# Patient Record
Sex: Female | Born: 1995 | Race: Black or African American | Hispanic: No | Marital: Single | Smoking: Current some day smoker
Health system: Southern US, Community
[De-identification: ages and names within clinical notes are randomized; demographics above are authoritative.]

## PROBLEM LIST (undated history)

## (undated) HISTORY — PX: HERNIA REPAIR: SHX51

---

## 2017-01-05 ENCOUNTER — Encounter: Payer: Self-pay | Admitting: Certified Nurse Midwife

## 2017-01-13 ENCOUNTER — Emergency Department (HOSPITAL_COMMUNITY)
Admission: EM | Admit: 2017-01-13 | Discharge: 2017-01-13 | Disposition: A | Discharge status: Home / Self Care | Payer: Self-pay | Attending: Emergency Medicine | Admitting: Emergency Medicine

## 2017-01-13 ENCOUNTER — Encounter (HOSPITAL_COMMUNITY): Payer: Self-pay | Admitting: *Deleted

## 2017-01-13 ENCOUNTER — Other Ambulatory Visit: Payer: Self-pay

## 2017-01-13 DIAGNOSIS — F17200 Nicotine dependence, unspecified, uncomplicated: Secondary | ICD-10-CM | POA: Insufficient documentation

## 2017-01-13 DIAGNOSIS — Z5321 Procedure and treatment not carried out due to patient leaving prior to being seen by health care provider: Secondary | ICD-10-CM | POA: Insufficient documentation

## 2017-01-13 DIAGNOSIS — R102 Pelvic and perineal pain: Secondary | ICD-10-CM | POA: Insufficient documentation

## 2017-01-13 DIAGNOSIS — K529 Noninfective gastroenteritis and colitis, unspecified: Secondary | ICD-10-CM | POA: Insufficient documentation

## 2017-01-13 LAB — COMPREHENSIVE METABOLIC PANEL
ALT: 11 U/L — AB (ref 14–54)
ANION GAP: 8 (ref 5–15)
AST: 15 U/L (ref 15–41)
Albumin: 4.1 g/dL (ref 3.5–5.0)
Alkaline Phosphatase: 42 U/L (ref 38–126)
BUN: 18 mg/dL (ref 6–20)
CALCIUM: 8.8 mg/dL — AB (ref 8.9–10.3)
CHLORIDE: 104 mmol/L (ref 101–111)
CO2: 24 mmol/L (ref 22–32)
CREATININE: 0.93 mg/dL (ref 0.44–1.00)
Glucose, Bld: 90 mg/dL (ref 65–99)
Potassium: 3.5 mmol/L (ref 3.5–5.1)
SODIUM: 136 mmol/L (ref 135–145)
Total Bilirubin: 1.1 mg/dL (ref 0.3–1.2)
Total Protein: 7 g/dL (ref 6.5–8.1)

## 2017-01-13 LAB — URINALYSIS, ROUTINE W REFLEX MICROSCOPIC
Bacteria, UA: NONE SEEN
Bilirubin Urine: NEGATIVE
Glucose, UA: NEGATIVE mg/dL
Hgb urine dipstick: NEGATIVE
Ketones, ur: 5 mg/dL — AB
Leukocytes, UA: NEGATIVE
Nitrite: NEGATIVE
Protein, ur: 30 mg/dL — AB
Specific Gravity, Urine: 1.03 (ref 1.005–1.030)
pH: 5 (ref 5.0–8.0)

## 2017-01-13 LAB — I-STAT BETA HCG BLOOD, ED (MC, WL, AP ONLY): I-stat hCG, quantitative: <5 m[IU]/mL

## 2017-01-13 LAB — CBC
HCT: 36.9 % (ref 36.0–46.0)
Hemoglobin: 12.7 g/dL (ref 12.0–15.0)
MCH: 30.5 pg (ref 26.0–34.0)
MCHC: 34.4 g/dL (ref 30.0–36.0)
MCV: 88.5 fL (ref 78.0–100.0)
Platelets: 270 10*3/uL (ref 150–400)
RBC: 4.17 MIL/uL (ref 3.87–5.11)
RDW: 12.4 % (ref 11.5–15.5)
WBC: 5.1 10*3/uL (ref 4.0–10.5)

## 2017-01-13 LAB — LIPASE, BLOOD: Lipase: 27 U/L (ref 11–51)

## 2017-01-13 NOTE — ED Notes (Signed)
Patient left her BP cuff and stickers on nurse first desk and walked out ED entrance. Pt ambulated with steady gait. NAD noted upon her leaving.

## 2017-01-13 NOTE — ED Triage Notes (Signed)
Pt arrives to ED via POV with c/o generalized ABD pain x2 days with N/V. Pt reports seeking treatment at [facility] shortly PTA, but left d/t wait time. Chart review shows that blood work, UA, and preg tests were all completed at [facility]. Pt denies fever, no CP or SHOB, no diarrhea. Pt is A&O, in NAD; RR even, regular, and unlabored; skin color/temp is WNL.

## 2017-01-13 NOTE — ED Triage Notes (Signed)
Pt reports abd cramping and bleeding that started yesterday. Having vaginal bleeding and states it is not time for her normal menstrual. Denies any other vaginal discharge or urinary symptoms.

## 2017-01-14 ENCOUNTER — Emergency Department: Payer: Self-pay

## 2017-01-14 ENCOUNTER — Emergency Department
Admission: EM | Admit: 2017-01-14 | Discharge: 2017-01-14 | Disposition: A | Discharge status: Home / Self Care | Payer: Self-pay | Attending: Emergency Medicine | Admitting: Emergency Medicine

## 2017-01-14 DIAGNOSIS — R102 Pelvic and perineal pain: Secondary | ICD-10-CM

## 2017-01-14 LAB — CHLAMYDIA/NGC RT PCR (ARMC ONLY)
Chlamydia Tr: NOT DETECTED
N gonorrhoeae: NOT DETECTED

## 2017-01-14 LAB — WET PREP, GENITAL
CLUE CELLS WET PREP: NONE SEEN
SPERM: NONE SEEN
TRICH WET PREP: NONE SEEN
WBC, Wet Prep HPF POC: NONE SEEN
Yeast Wet Prep HPF POC: NONE SEEN

## 2017-01-14 NOTE — ED Notes (Signed)
Pelvic cart at bedside. 

## 2017-01-14 NOTE — ED Notes (Signed)
Patient transported to Ultrasound 

## 2017-01-14 NOTE — ED Notes (Signed)

## 2017-01-14 NOTE — ED Notes (Signed)
ED Provider at bedside. 

## 2017-01-14 NOTE — ED Notes (Signed)
Pt states being woken from sleep yesterday morning by an aching pain to the left mid-lower abdomen. Pt states consistent pain, x1 episode vomiting,, denies diarrhea, denies urinary changes, denies fever, denies OTC tx. Pt states hx ovarian cysts.

## 2017-01-14 NOTE — ED Provider Notes (Signed)
[facility] Emergency Department Provider Note __   First MD Initiated Contact with Patient 01/14/17 0023     (approximate)  I have reviewed the triage vital signs and the nursing notes.   HISTORY  Chief Complaint Abdominal Pain; Nausea; and Emesis   HPI Brianna Atkinson is a 21 y.o. female presents withleft lower quadrant abdominal pain 2 days accompanied by nausea and one episode of vomiting tonight. Patient states maximum intensity of pain is 8/9 out of 10 current pain score is 5 out of 10. Patient denies any diarrhea. Patient states discomfort when she has to hold her urine". Patient denies any fever afebrile on presentation. Patient denies any unusual vaginal discharge. Patient does admit to unprotected sex.   Past medical history Chlamydia There are no active problems to display for this patient.   Past Surgical History:  Procedure Laterality Date  . HERNIA REPAIR      Prior to Admission medications   Medication Sig Start Date End Date Taking? Authorizing Provider  naproxen (NAPROSYN) 500 MG tablet Take 500 mg 2 (two) times daily as needed by mouth (for back pain).  09/04/16 09/04/17  [provider]    Allergies no known drug allergies No family history on file.  Social History Social History   Tobacco Use  . Smoking status: Current Some Day Smoker  . Smokeless tobacco: Never Used  Substance Use Topics  . Alcohol use: No    Frequency: Never  . Drug use: No    Review of Systems Constitutional: No fever/chills Eyes: No visual changes. ENT: No sore throat. Cardiovascular: Denies chest pain. Respiratory: Denies shortness of breath. Gastrointestinal:  positive for abdominal pain.  No nausea, positive vomiting.  No diarrhea.  No constipation. Genitourinary: Positive for dysuria. Positive for pelvic pain Musculoskeletal: Negative for neck pain.  Negative for back pain. Integumentary: Negative for rash. Neurological: Negative  for headaches, focal weakness or numbness.   ____________________________________________   PHYSICAL EXAM:  VITAL SIGNS: ED Triage Vitals  Enc Vitals Group     BP 01/13/17 2048 126/78     Pulse Rate 01/13/17 2048 90     Resp 01/13/17 2048 17     Temp 01/13/17 2048 98.6 F (37 C)     Temp Source 01/13/17 2048 Oral     SpO2 01/13/17 2048 100 %     Weight 01/13/17 2049 61.7 kg (136 lb)     Height 01/13/17 2049 1.524 m (5')     Head Circumference --      Peak Flow --      Pain Score 01/13/17 2048 5     Pain Loc --      Pain Edu? --      Excl. in GC? --     Constitutional: Alert and oriented. Well appearing and in no acute distress. Eyes: Conjunctivae are normal. PERRL. EOMI. Head: Atraumatic. Mouth/Throat: Mucous membranes are moist.  Oropharynx non-erythematous. Neck: No stridor.  Cardiovascular: Normal rate, regular rhythm. Good peripheral circulation. Grossly normal heart sounds. Respiratory: Normal respiratory effort.  No retractions. Lungs CTAB. Gastrointestinal: Soft and nontender. No distention.   Genitourinary: Active menses Musculoskeletal: No lower extremity tenderness nor edema. No gross deformities of extremities. Neurologic:  Normal speech and language. No gross focal neurologic deficits are appreciated.  Skin:  Skin is warm, dry and intact. No rash noted. Psychiatric: Mood and affect are normal. Speech and behavior are normal.  ____________________________________________   LABS (all labs ordered are listed, but only  abnormal results are displayed)  Labs Reviewed  CHLAMYDIA/NGC RT PCR ([facility] ONLY)  WET PREP, GENITAL   ________________________________  RADIOLOGY I, Eden N BROWN, personally viewed and evaluated these images (plain radiographs) as part of my medical decision making, as well as reviewing the written report by the radiologist.  Ct Renal Stone Study  Result Date: 01/14/2017 CLINICAL DATA:  Flank pain EXAM: CT ABDOMEN AND  PELVIS WITHOUT CONTRAST TECHNIQUE: Multidetector CT imaging of the abdomen and pelvis was performed following the standard protocol without IV contrast. COMPARISON:  None. FINDINGS: Lower chest: No acute abnormality. Hepatobiliary: No focal liver abnormality is seen. No gallstones, gallbladder wall thickening, or biliary dilatation. Pancreas: Unremarkable. No pancreatic ductal dilatation or surrounding inflammatory changes. Spleen: Normal in size without focal abnormality. Adrenals/Urinary Tract: Adrenal glands are unremarkable. Kidneys are normal, focal lesion, or hydronephrosis. Punctate nonobstructing stones within the kidneys on coronal view. Bladder is unremarkable. Stomach/Bowel: Stomach is within normal limits. Appendix appears normal. No evidence of bowel wall thickening, distention, or inflammatory changes. Vascular/Lymphatic: No significant vascular findings are present. No enlarged abdominal or pelvic lymph nodes. Reproductive: Uterus and bilateral adnexa are unremarkable. Other: No abdominal wall hernia or abnormality. No abdominopelvic ascites. Musculoskeletal: No acute or significant osseous findings. IMPRESSION: 1. Negative for hydronephrosis or ureteral stone 2. There are no acute abnormalities visualized Electronically Signed   By: Jasmine PangKim  Fujinaga M.D.   On: 01/14/2017 03:00   [place] Pelvic Complete With Transvaginal  Result Date: 01/14/2017 CLINICAL DATA:  Lower abdominal pain for 1 day.  Negative beta HCG. EXAM: TRANSABDOMINAL AND TRANSVAGINAL ULTRASOUND OF PELVIS TECHNIQUE: Both transabdominal and transvaginal ultrasound examinations of the pelvis were performed. Transabdominal technique was performed for global imaging of the pelvis including uterus, ovaries, adnexal regions, and pelvic cul-de-sac. It was necessary to proceed with endovaginal exam following the transabdominal exam to visualize the ovaries and endometrium. COMPARISON:  None FINDINGS: Uterus Measurements: 6.7 x 3.1 x 3.4 cm. No  fibroids or other mass visualized. Endometrium Thickness: 3.9 mm in the uterine fundus, 12 mm in the endocervical region. No focal abnormalities are identified. No endometrial fluid. Right ovary Measurements: 2.7 x 1.6 x 1.6 cm. Normal appearance/no adnexal mass. Left ovary Measurements: 3.2 x 1.8 x 1.9 cm. Normal appearance/no adnexal mass. Other findings Small amount of free fluid in the pelvis. IMPRESSION: Mild endometrial prominence in the endocervical region of nonspecific etiology. No focal endometrial lesions are identified in the remaining endometrial stripe is normal in thickness. Otherwise normal appearance of the uterus and ovaries. Electronically Signed   By: Burman NievesWilliam  Stevens M.D.   On: 01/14/2017 02:03    ________________  Procedures   ____________________________________________   INITIAL IMPRESSION / ASSESSMENT AND PLAN / ED COURSE  As part of my medical decision making, I reviewed the following data within the electronic MEDICAL RECORD NUMBER 21 year old female presenting with above stated history of physical exam concern for possible intra-abdominal gastroenteritis versus pelvic pathology including PID gonorrhea and chlamydia. Ultrasound of the pelvis reveal no acute intrapelvic pathology. CT scan of the abdomen revealed no intra-abdominal pathology as well.     ____________________________________________  FINAL CLINICAL IMPRESSION(S) / ED DIAGNOSES  Final diagnoses:  Pelvic pain  Gastroenteritis    MEDICATIONS GIVEN DURING THIS VISIT:  Medications - No data to display   ED Discharge Orders    None       Note:  This document was prepared using Dragon voice recognition software and may include unintentional dictation errors.    Manson PasseyBrown,  Enedina Finnerandolph N, MD 01/14/17 1236

## 2017-03-21 ENCOUNTER — Emergency Department: Payer: Medicaid Other

## 2017-03-21 ENCOUNTER — Emergency Department
Admission: EM | Admit: 2017-03-21 | Discharge: 2017-03-21 | Disposition: A | Discharge status: Home / Self Care | Payer: Medicaid Other | Attending: Emergency Medicine | Admitting: Emergency Medicine

## 2017-03-21 ENCOUNTER — Encounter: Payer: Self-pay | Admitting: Emergency Medicine

## 2017-03-21 ENCOUNTER — Other Ambulatory Visit: Payer: Self-pay

## 2017-03-21 DIAGNOSIS — R079 Chest pain, unspecified: Secondary | ICD-10-CM | POA: Insufficient documentation

## 2017-03-21 DIAGNOSIS — F17210 Nicotine dependence, cigarettes, uncomplicated: Secondary | ICD-10-CM | POA: Insufficient documentation

## 2017-03-21 DIAGNOSIS — R102 Pelvic and perineal pain: Secondary | ICD-10-CM

## 2017-03-21 DIAGNOSIS — N760 Acute vaginitis: Secondary | ICD-10-CM | POA: Insufficient documentation

## 2017-03-21 DIAGNOSIS — B9689 Other specified bacterial agents as the cause of diseases classified elsewhere: Secondary | ICD-10-CM

## 2017-03-21 LAB — CBC
HEMATOCRIT: 37.4 % (ref 35.0–47.0)
Hemoglobin: 12.7 g/dL (ref 12.0–16.0)
MCH: 30.5 pg (ref 26.0–34.0)
MCHC: 33.8 g/dL (ref 32.0–36.0)
MCV: 90.1 fL (ref 80.0–100.0)
Platelets: 246 10*3/uL (ref 150–440)
RBC: 4.15 MIL/uL (ref 3.80–5.20)
RDW: 13.3 % (ref 11.5–14.5)
WBC: 4.4 10*3/uL (ref 3.6–11.0)

## 2017-03-21 LAB — WET PREP, GENITAL
Sperm: NONE SEEN
Trich, Wet Prep: NONE SEEN
Yeast Wet Prep HPF POC: NONE SEEN

## 2017-03-21 LAB — CHLAMYDIA/NGC RT PCR (ARMC ONLY)
CHLAMYDIA TR: NOT DETECTED
N gonorrhoeae: NOT DETECTED

## 2017-03-21 LAB — URINALYSIS, COMPLETE (UACMP) WITH MICROSCOPIC
BACTERIA UA: NONE SEEN
Bilirubin Urine: NEGATIVE
Glucose, UA: NEGATIVE mg/dL
Hgb urine dipstick: NEGATIVE
Ketones, ur: 20 mg/dL — AB
Leukocytes, UA: NEGATIVE
NITRITE: NEGATIVE
Protein, ur: NEGATIVE mg/dL
Specific Gravity, Urine: 1.029 (ref 1.005–1.030)
pH: 5 (ref 5.0–8.0)

## 2017-03-21 LAB — BASIC METABOLIC PANEL
ANION GAP: 7 (ref 5–15)
BUN: 24 mg/dL — ABNORMAL HIGH (ref 6–20)
CO2: 25 mmol/L (ref 22–32)
Calcium: 9.1 mg/dL (ref 8.9–10.3)
Chloride: 106 mmol/L (ref 101–111)
Creatinine, Ser: 0.93 mg/dL (ref 0.44–1.00)
Glucose, Bld: 93 mg/dL (ref 65–99)
POTASSIUM: 4.1 mmol/L (ref 3.5–5.1)
SODIUM: 138 mmol/L (ref 135–145)

## 2017-03-21 LAB — FIBRIN DERIVATIVES D-DIMER (ARMC ONLY): FIBRIN DERIVATIVES D-DIMER (ARMC): 108.33 ng{FEU}/mL (ref 0.00–499.00)

## 2017-03-21 LAB — TROPONIN I: Troponin I: <0.03 ng/mL (ref <0.03)

## 2017-03-21 MED ORDER — METRONIDAZOLE 500 MG PO TABS: 500.0000 mg | ORAL_TABLET | Freq: Two times a day (BID) | ORAL | 0 refills | Status: DC

## 2017-03-21 MED ORDER — ACETAMINOPHEN 500 MG PO TABS
1000.0000 mg | ORAL_TABLET | Freq: Once | ORAL | Status: AC
  Administered 2017-03-21: 1000 mg via ORAL
  Filled 2017-03-21: qty 2

## 2017-03-21 NOTE — ED Triage Notes (Signed)
First Nurse Note:  Arrives with C/O mid back, chest pain with cough and sinus congestion.  No SOB/ DOE.  Ambulates with easy and steady gait. NAD

## 2017-03-21 NOTE — ED Triage Notes (Signed)
Pt presents to ED with c/o L sided chest pain under her breast with "changes in [her] breathing". Pt also c/o cough, nausea, and lower back pain. Pt c/o all symptoms x 3 days. Pt states nothing makes pain better or worse.

## 2017-03-21 NOTE — Discharge Instructions (Signed)
Please follow up with a primary care physician regarding your chest pain and your pelvic pain.  Please take the entire course of antibiotics, even if you are feeling better.  Do not resume sexual intercourse until you have completed your treatment, and your partner has been screened for STD's.  Return to the emergency department for severe pain, shortness of breath, palpitations, lightheadedness or syncope, or any other symptoms concerning to you.

## 2017-03-21 NOTE — ED Provider Notes (Signed)
[facility] Emergency Department Provider Note  ____________________________________________  Time seen: Approximately 4:30 PM  I have reviewed the triage vital signs and the nursing notes.   HISTORY  Chief Complaint Chest Pain and Back Pain    HPI Brianna Atkinson is a 22 y.o. female my otherwise healthy, presenting with left-sided chest pain, as well as pelvic pain and increased vaginal discharge.  The patient reports that since Friday, she has had 2-3 daily episodes of a "crushing" left-sided chest pain under the left breast that lasts 10-30 minutes and self-resolves.  Occasionally she leans forward and has associated bilateral pelvic pain.  She notes increased vaginal discharge.  She denies any pleuritic pain, shortness of breath, palpitations, lightheadedness, syncope.  No association with food, position. No trauma or strain.  No skin changes.  Pt has not tried anything for her symptoms.  History reviewed. No pertinent past medical history.  There are no active problems to display for this patient.   Past Surgical History:  Procedure Laterality Date  . HERNIA REPAIR      Current Outpatient Rx  . Order #: [id number] Class: Print  . Order #: [id number] Class: Historical Med    Allergies Patient has no known allergies.  History reviewed. No pertinent family history.  Social History Social History   Tobacco Use  . Smoking status: Current Some Day Smoker    Types: Cigarettes  . Smokeless tobacco: Never Used  Substance Use Topics  . Alcohol use: No    Frequency: Never  . Drug use: No    Review of Systems Constitutional: No fever/chills. No lightheadedness or syncope. Eyes: No visual changes. ENT: No sore throat. No congestion or rhinorrhea. Cardiovascular: + chest pain. Denies palpitations. Respiratory: Denies shortness of breath.  No cough. Gastrointestinal: No abdominal pain.  No nausea, no vomiting.  No diarrhea.  No  constipation. Genitourinary: Negative for dysuria. + increased vaginal discharge. Musculoskeletal: Negative for back pain. Skin: Negative for rash. Neurological: Negative for headaches. No focal numbness, tingling or weakness.     ____________________________________________   PHYSICAL EXAM:  VITAL SIGNS: ED Triage Vitals  Enc Vitals Group     BP 03/21/17 1543 101/63     Pulse Rate 03/21/17 1543 79     Resp 03/21/17 1543 16     Temp 03/21/17 1543 98.2 F (36.8 C)     Temp Source 03/21/17 1543 Oral     SpO2 03/21/17 1543 100 %     Weight 03/21/17 1541 134 lb (60.8 kg)     Height 03/21/17 1541 5' (1.524 m)     Head Circumference --      Peak Flow --      Pain Score 03/21/17 1540 7     Pain Loc --      Pain Edu? --      Excl. in GC? --     Constitutional: Alert and oriented. Well appearing and in no acute distress. Answers questions appropriately. Eyes: Conjunctivae are normal.  EOMI. No scleral icterus. Head: Atraumatic. Nose: No congestion/rhinnorhea. Mouth/Throat: Mucous membranes are moist.  Neck: No stridor.  Supple.  No JVD. Cardiovascular: Normal rate, regular rhythm. No murmurs, rubs or gallops.  Normal chest, no overlying skin changes, no pain with palpation. Respiratory: Normal respiratory effort.  No accessory muscle use or retractions. Lungs CTAB.  No wheezes, rales or ronchi. Gastrointestinal: Soft, nontender and nondistended.  No guarding or rebound.  No peritoneal signs. Genitourinary: Normal-appearing external genitalia without lesions. Vaginal exam with thich white  and malodorous discharge, normal-appearing cervix, normal vaginal wall tissue. Bimanual exam is negative for CMT, adnexal tenderness to palpation, no palpable masses. Musculoskeletal: No LE edema. No ttp in the calves or palpable cords.  Negative Homan's sign. Neurologic:  A&Ox3.  Speech is clear.  Face and smile are symmetric.  EOMI.  Moves all extremities well. Skin:  Skin is warm, dry and  intact. No rash noted. Psychiatric: Mood and affect are normal. Speech and behavior are normal.  Normal judgement.  ____________________________________________   LABS (all labs ordered are listed, but only abnormal results are displayed)  Labs Reviewed  WET PREP, GENITAL - Abnormal; Notable for the following components:      Result Value   Clue Cells Wet Prep HPF POC PRESENT (*)    WBC, Wet Prep HPF POC RARE (*)    All other components within normal limits  BASIC METABOLIC PANEL - Abnormal; Notable for the following components:   BUN 24 (*)    All other components within normal limits  URINALYSIS, COMPLETE (UACMP) WITH MICROSCOPIC - Abnormal; Notable for the following components:   Color, Urine YELLOW (*)    APPearance CLEAR (*)    Ketones, ur 20 (*)    Squamous Epithelial / LPF 0-5 (*)    All other components within normal limits  CHLAMYDIA/NGC RT PCR ([facility] ONLY)  CBC  TROPONIN I  FIBRIN DERIVATIVES D-DIMER ([facility] ONLY)  POC URINE PREG, ED   ____________________________________________  EKG  ED ECG REPORT I, Rockne Menghini, the attending physician, personally viewed and interpreted this ECG.   Date: 03/21/2017  EKG Time: 1541  Rate: 75  Rhythm: normal sinus rhythm; + PVC  Axis: normal  Intervals:none  ST&T Change: Specific T wave inversion in V1.  No STEMI.  No prolonged QTC, no Brugada syndrome, hypertrophy.  ____________________________________________  RADIOLOGY  Dg Chest 2 View  Result Date: 03/21/2017 CLINICAL DATA:  Left chest pain. EXAM: CHEST  2 VIEW COMPARISON:  None. FINDINGS: The heart size and mediastinal contours are within normal limits. Both lungs are clear. The visualized skeletal structures are unremarkable. IMPRESSION: No active cardiopulmonary disease. Electronically Signed   By: Sherian Rein M.D.   On: 03/21/2017 16:29    ____________________________________________   PROCEDURES  Procedure(s) performed:  None  Procedures  Critical Care performed: No ____________________________________________   INITIAL IMPRESSION / ASSESSMENT AND PLAN / ED COURSE  Pertinent labs & imaging results that were available during my care of the patient were reviewed by me and considered in my medical decision making (see chart for details).  22 y.o. F, otherwise healthy, presenting w/ chest pain and pelvic pain with increased vaginal discharge.  It is unlikely that her symptoms are related.  From a chest pain standpoint, she has no evidence of ischemia, preexcitation syndrome, or arrhythmia.  Her troponin is also negative.  Chest x-ray does not show any acute cardiopulmonary process.  A d-dimer is pending. The etiology of her pain is unclear, but there is no evidence to suggest an acute emergency pathology today.    For the patient's pelvic pain, we will perform UA, pelvic examination with cultures, and pregnancy test.  Plan re-evaluation for final disposition.  ----------------------------------------- 7:24 PM on 03/21/2017 -----------------------------------------  The patient's d-dimer is negative.  At this time, the patient is safe to be discharged home from a cardiac standpoint.  Her blood cultures do show bacterial vaginosis and I will plan to treat her with a 7-day course of Flagyl.  Return precautions as  well as follow-up instructions were discussed  ____________________________________________  FINAL CLINICAL IMPRESSION(S) / ED DIAGNOSES  Final diagnoses:  Left sided chest pain  Bacterial vaginosis  Pelvic pain         NEW MEDICATIONS STARTED DURING THIS VISIT:  New Prescriptions   METRONIDAZOLE (FLAGYL) 500 MG TABLET    Take 1 tablet (500 mg total) by mouth 2 (two) times daily.      Rockne MenghiniNorman, Anne-Caroline, MD 03/21/17 1924

## 2017-07-18 ENCOUNTER — Emergency Department (HOSPITAL_COMMUNITY)
Admission: EM | Admit: 2017-07-18 | Discharge: 2017-07-18 | Disposition: A | Discharge status: Home / Self Care | Payer: Self-pay | Attending: Emergency Medicine | Admitting: Emergency Medicine

## 2017-07-18 ENCOUNTER — Emergency Department (HOSPITAL_COMMUNITY): Payer: Self-pay

## 2017-07-18 ENCOUNTER — Encounter (HOSPITAL_COMMUNITY): Payer: Self-pay

## 2017-07-18 ENCOUNTER — Other Ambulatory Visit: Payer: Self-pay

## 2017-07-18 DIAGNOSIS — R6883 Chills (without fever): Secondary | ICD-10-CM | POA: Insufficient documentation

## 2017-07-18 DIAGNOSIS — R61 Generalized hyperhidrosis: Secondary | ICD-10-CM | POA: Insufficient documentation

## 2017-07-18 DIAGNOSIS — R112 Nausea with vomiting, unspecified: Secondary | ICD-10-CM | POA: Insufficient documentation

## 2017-07-18 DIAGNOSIS — R1084 Generalized abdominal pain: Secondary | ICD-10-CM | POA: Insufficient documentation

## 2017-07-18 DIAGNOSIS — R0602 Shortness of breath: Secondary | ICD-10-CM | POA: Insufficient documentation

## 2017-07-18 DIAGNOSIS — F17290 Nicotine dependence, other tobacco product, uncomplicated: Secondary | ICD-10-CM | POA: Insufficient documentation

## 2017-07-18 DIAGNOSIS — R0789 Other chest pain: Secondary | ICD-10-CM | POA: Insufficient documentation

## 2017-07-18 LAB — URINALYSIS, ROUTINE W REFLEX MICROSCOPIC
BILIRUBIN URINE: NEGATIVE
Glucose, UA: NEGATIVE mg/dL
Hgb urine dipstick: NEGATIVE
Ketones, ur: NEGATIVE mg/dL
LEUKOCYTES UA: NEGATIVE
NITRITE: NEGATIVE
PH: 6 (ref 5.0–8.0)
Protein, ur: NEGATIVE mg/dL
SPECIFIC GRAVITY, URINE: 1.03 (ref 1.005–1.030)

## 2017-07-18 LAB — BASIC METABOLIC PANEL
ANION GAP: 7 (ref 5–15)
BUN: 27 mg/dL — AB (ref 6–20)
CO2: 28 mmol/L (ref 22–32)
Calcium: 8.9 mg/dL (ref 8.9–10.3)
Chloride: 104 mmol/L (ref 101–111)
Creatinine, Ser: 0.89 mg/dL (ref 0.44–1.00)
GFR calc Af Amer: >60 mL/min (ref >60)
GFR calc non Af Amer: >60 mL/min (ref >60)
Glucose, Bld: 104 mg/dL — ABNORMAL HIGH (ref 65–99)
POTASSIUM: 4.4 mmol/L (ref 3.5–5.1)
SODIUM: 139 mmol/L (ref 135–145)

## 2017-07-18 LAB — I-STAT TROPONIN, ED: Troponin i, poc: 0.01 ng/mL (ref 0.00–0.08)

## 2017-07-18 LAB — I-STAT BETA HCG BLOOD, ED (MC, WL, AP ONLY)

## 2017-07-18 LAB — CBC
HEMATOCRIT: 36.5 % (ref 36.0–46.0)
HEMOGLOBIN: 12.2 g/dL (ref 12.0–15.0)
MCH: 29.7 pg (ref 26.0–34.0)
MCHC: 33.4 g/dL (ref 30.0–36.0)
MCV: 88.8 fL (ref 78.0–100.0)
Platelets: 284 10*3/uL (ref 150–400)
RBC: 4.11 MIL/uL (ref 3.87–5.11)
RDW: 12.5 % (ref 11.5–15.5)
WBC: 5.4 10*3/uL (ref 4.0–10.5)

## 2017-07-18 LAB — HEPATIC FUNCTION PANEL
ALBUMIN: 4.1 g/dL (ref 3.5–5.0)
ALT: 9 U/L — ABNORMAL LOW (ref 14–54)
AST: 20 U/L (ref 15–41)
Alkaline Phosphatase: 45 U/L (ref 38–126)
BILIRUBIN INDIRECT: 1.2 mg/dL — AB (ref 0.3–0.9)
Bilirubin, Direct: 0.5 mg/dL (ref 0.1–0.5)
Total Bilirubin: 1.7 mg/dL — ABNORMAL HIGH (ref 0.3–1.2)
Total Protein: 7 g/dL (ref 6.5–8.1)

## 2017-07-18 LAB — LIPASE, BLOOD: LIPASE: 32 U/L (ref 11–51)

## 2017-07-18 MED ORDER — ONDANSETRON 4 MG PO TBDP: 4.0000 mg | ORAL_TABLET | Freq: Three times a day (TID) | ORAL | 0 refills | Status: AC | PRN

## 2017-07-18 MED ORDER — ONDANSETRON 4 MG PO TBDP
8.0000 mg | ORAL_TABLET | Freq: Once | ORAL | Status: AC
  Administered 2017-07-18: 8 mg via ORAL
  Filled 2017-07-18: qty 2

## 2017-07-18 MED ORDER — GI COCKTAIL ~~LOC~~
30.0000 mL | Freq: Once | ORAL | Status: AC
  Administered 2017-07-18: 30 mL via ORAL
  Filled 2017-07-18: qty 30

## 2017-07-18 NOTE — ED Notes (Signed)
Results reviewed.  No changes in acuity at this time 

## 2017-07-18 NOTE — Discharge Instructions (Signed)
Please take Zofran as needed for nausea Return if worsening

## 2017-07-18 NOTE — ED Provider Notes (Signed)
[facility] EMERGENCY DEPARTMENT Provider Note   CSN: [id number] Arrival date & time: 07/18/17  1548     History   Chief Complaint Chief Complaint  Patient presents with  . Chest Pain  . Shortness of Breath    HPI Brianna Atkinson is a 22 y.o. female who presents with vomiting, chest pain and shortness of breath.  No significant past medical history.  She states that she started throwing up 2 days ago.  She has been throwing up every time she eats.  She has been able to tolerate fluids.  She also started having intermittent chest pain and shortness of breath.  She reports generalized abdominal cramping which feels like she is on her period.  Her last menstrual period was approximately 2 weeks ago.  She has been having unprotected sexual intercourse and is concerned about pregnancy.  She took a home pregnancy test which was negative.  She denies fever but has had chills and sweats.  She does not have cough or any wheezing.  She has not had any diarrhea, urinary symptoms, vaginal discharge. She has not taken anything for symptoms.  HPI  History reviewed. No pertinent past medical history.  There are no active problems to display for this patient.   Past Surgical History:  Procedure Laterality Date  . HERNIA REPAIR       OB History   None      Home Medications    Prior to Admission medications   Medication Sig Start Date End Date Taking? Authorizing Provider  metroNIDAZOLE (FLAGYL) 500 MG tablet Take 1 tablet (500 mg total) by mouth 2 (two) times daily. 03/21/17   Rockne Menghini, MD  naproxen (NAPROSYN) 500 MG tablet Take 500 mg 2 (two) times daily as needed by mouth (for back pain).  09/04/16 09/04/17  [provider]    Family History History reviewed. No pertinent family history.  Social History Social History   Tobacco Use  . Smoking status: Current Some Day Smoker    Types: E-cigarettes  . Smokeless tobacco: Never Used  Substance  Use Topics  . Alcohol use: Yes    Frequency: Never    Comment: occ  . Drug use: No     Allergies   Patient has no known allergies.   Review of Systems Review of Systems  Constitutional: Positive for chills and diaphoresis. Negative for fever.  HENT: Negative for sore throat.   Respiratory: Positive for shortness of breath. Negative for cough and wheezing.   Cardiovascular: Positive for chest pain. Negative for palpitations and leg swelling.  Gastrointestinal: Positive for abdominal pain, nausea and vomiting. Negative for diarrhea.  Genitourinary: Negative for dysuria, hematuria, vaginal bleeding and vaginal discharge.  All other systems reviewed and are negative.    Physical Exam Updated Vital Signs BP 114/86 (BP Location: Right Arm)   Pulse 85   Temp 98.5 F (36.9 C) (Oral)   Resp 16   Ht 5' (1.524 m)   Wt 62.6 kg (138 lb)   LMP 07/05/2017 (Approximate)   SpO2 99%   BMI 26.95 kg/m   Physical Exam  Constitutional: She is oriented to person, place, and time. She appears well-developed and well-nourished. No distress.  HENT:  Head: Normocephalic and atraumatic.  Right Ear: Tympanic membrane normal.  Left Ear: Tympanic membrane normal.  Nose: Nose normal.  Mouth/Throat: Uvula is midline, oropharynx is clear and moist and mucous membranes are normal.  Eyes: Pupils are equal, round, and reactive to light. Conjunctivae  are normal. Right eye exhibits no discharge. Left eye exhibits no discharge. No scleral icterus.  Neck: Normal range of motion.  Cardiovascular: Normal rate and regular rhythm.  Pulmonary/Chest: Effort normal and breath sounds normal. No respiratory distress.  Abdominal: Soft. Bowel sounds are normal. She exhibits no distension. There is tenderness (mild epigastric tenderness).  Neurological: She is alert and oriented to person, place, and time.  Skin: Skin is warm and dry.  Psychiatric: She has a normal mood and affect. Her behavior is normal.  Nursing  note and vitals reviewed.    ED Treatments / Results  Labs (all labs ordered are listed, but only abnormal results are displayed) Labs Reviewed  BASIC METABOLIC PANEL - Abnormal; Notable for the following components:      Result Value   Glucose, Bld 104 (*)    BUN 27 (*)    All other components within normal limits  HEPATIC FUNCTION PANEL - Abnormal; Notable for the following components:   ALT 9 (*)    Total Bilirubin 1.7 (*)    Indirect Bilirubin 1.2 (*)    All other components within normal limits  URINALYSIS, ROUTINE W REFLEX MICROSCOPIC - Abnormal; Notable for the following components:   APPearance HAZY (*)    All other components within normal limits  CBC  LIPASE, BLOOD  I-STAT TROPONIN, ED  I-STAT BETA HCG BLOOD, ED ([facility], [facility], [facility] ONLY)    EKG None  Radiology Dg Chest 2 View  Result Date: 07/18/2017 CLINICAL DATA:  Acute chest pain and shortness of breath today. EXAM: CHEST - 2 VIEW COMPARISON:  03/21/2017 radiographs FINDINGS: The cardiomediastinal silhouette is unremarkable. There is no evidence of focal airspace disease, pulmonary edema, suspicious pulmonary nodule/mass, pleural effusion, or pneumothorax. No acute bony abnormalities are identified. IMPRESSION: No active cardiopulmonary disease. Electronically Signed   By: Harmon Pier M.D.   On: 07/18/2017 16:21    Procedures Procedures (including critical care time)  Medications Ordered in ED Medications  gi cocktail (Maalox,Lidocaine,Donnatal) (30 mLs Oral Given 07/18/17 1848)  ondansetron (ZOFRAN-ODT) disintegrating tablet 8 mg (8 mg Oral Given 07/18/17 1846)     Initial Impression / Assessment and Plan / ED Course  I have reviewed the triage vital signs and the nursing notes.  Pertinent labs & imaging results that were available during my care of the patient were reviewed by me and considered in my medical decision making (see chart for details).  22 year old female presents with chest pain and abdominal pain  for the past several days along with nausea and vomiting.  Her vital signs are normal.  On exam her abdomen is minimally tender in the epigastric area.  She has no right upper quadrant tenderness.  CBC is normal.  BMP is normal.  LFTs are remarkable for mild elevation of bilirubin.  Chest x-ray and EKG are normal.  Her troponin is normal.  Her urine is normal.  She is not pregnant.  She was given Zofran and GI cocktail and was able to tolerate p.o.  We will give her prescription for Zofran and advised to return if worsening.  Final Clinical Impressions(s) / ED Diagnoses   Final diagnoses:  Atypical chest pain  Non-intractable vomiting with nausea, unspecified vomiting type    ED Discharge Orders    None       Beryle Quant 07/18/17 2013    Linwood Dibbles, MD 07/20/17 1757

## 2017-07-18 NOTE — ED Notes (Signed)
ED Provider at bedside. 

## 2017-07-18 NOTE — ED Notes (Signed)
Pt given ginger ale.

## 2017-07-18 NOTE — ED Triage Notes (Signed)
Pt endorses chest pain with some shob that began 30 minutes ago with nausea and 1 episode of vomiting. VSS.

## 2017-11-01 ENCOUNTER — Emergency Department (HOSPITAL_COMMUNITY)
Admission: EM | Admit: 2017-11-01 | Discharge: 2017-11-01 | Disposition: A | Discharge status: Home / Self Care | Payer: Medicaid Other | Attending: Emergency Medicine | Admitting: Emergency Medicine

## 2017-11-01 ENCOUNTER — Encounter (HOSPITAL_COMMUNITY): Payer: Self-pay | Admitting: Emergency Medicine

## 2017-11-01 ENCOUNTER — Other Ambulatory Visit: Payer: Self-pay

## 2017-11-01 DIAGNOSIS — Z202 Contact with and (suspected) exposure to infections with a predominantly sexual mode of transmission: Secondary | ICD-10-CM | POA: Insufficient documentation

## 2017-11-01 DIAGNOSIS — F17210 Nicotine dependence, cigarettes, uncomplicated: Secondary | ICD-10-CM | POA: Insufficient documentation

## 2017-11-01 DIAGNOSIS — B9689 Other specified bacterial agents as the cause of diseases classified elsewhere: Secondary | ICD-10-CM

## 2017-11-01 DIAGNOSIS — N760 Acute vaginitis: Secondary | ICD-10-CM

## 2017-11-01 LAB — WET PREP, GENITAL
SPERM: NONE SEEN
Trich, Wet Prep: NONE SEEN
Yeast Wet Prep HPF POC: NONE SEEN

## 2017-11-01 LAB — POC URINE PREG, ED: Preg Test, Ur: NEGATIVE

## 2017-11-01 MED ORDER — METRONIDAZOLE 500 MG PO TABS: 500.0000 mg | ORAL_TABLET | Freq: Two times a day (BID) | ORAL | 0 refills | Status: AC

## 2017-11-01 MED ORDER — AZITHROMYCIN 250 MG PO TABS
1000.0000 mg | ORAL_TABLET | Freq: Once | ORAL | Status: AC
  Administered 2017-11-01: 1000 mg via ORAL
  Filled 2017-11-01: qty 4

## 2017-11-01 MED ORDER — CEFTRIAXONE SODIUM 250 MG IJ SOLR
250.0000 mg | Freq: Once | INTRAMUSCULAR | Status: AC
  Administered 2017-11-01: 250 mg via INTRAMUSCULAR
  Filled 2017-11-01: qty 250

## 2017-11-01 MED ORDER — ONDANSETRON 4 MG PO TBDP
8.0000 mg | ORAL_TABLET | Freq: Once | ORAL | Status: AC
  Administered 2017-11-01: 8 mg via ORAL
  Filled 2017-11-01: qty 2

## 2017-11-01 NOTE — ED Triage Notes (Signed)
Pt states she needs an STD check. Denies symptoms, states her partner tested positive.

## 2017-11-01 NOTE — ED Provider Notes (Signed)
[facility] EMERGENCY DEPARTMENT Provider Note   CSN: [id number] Arrival date & time: 11/01/17  2015     History   Chief Complaint Chief Complaint  Patient presents with  . Exposure to STD    HPI Brianna Atkinson is a 22 y.o. female.  22yo F who p/w STD exposure.  Patient states that she was recently told by her sexual partner that he had intercourse with someone who tested positive for an STD.  He told her that he was treated for chlamydia.  She denies any specific complaints today.  She states that she always has vaginal discharge that has not changed recently.  No vaginal pain, pelvic pain, urinary symptoms, fevers, vomiting, or other complaints.  The history is provided by the patient.  Exposure to STD     History reviewed. No pertinent past medical history.  There are no active problems to display for this patient.   Past Surgical History:  Procedure Laterality Date  . HERNIA REPAIR       OB History   None      Home Medications    Prior to Admission medications   Medication Sig Start Date End Date Taking? Authorizing Provider  metroNIDAZOLE (FLAGYL) 500 MG tablet Take 1 tablet (500 mg total) by mouth 2 (two) times daily. 03/21/17   Rockne Menghini, MD  ondansetron (ZOFRAN ODT) 4 MG disintegrating tablet Take 1 tablet (4 mg total) by mouth every 8 (eight) hours as needed for nausea or vomiting. 07/18/17   Bethel Born, PA-C    Family History No family history on file.  Social History Social History   Tobacco Use  . Smoking status: Current Some Day Smoker    Types: E-cigarettes  . Smokeless tobacco: Never Used  Substance Use Topics  . Alcohol use: Yes    Frequency: Never    Comment: occ  . Drug use: No     Allergies   Patient has no known allergies.   Review of Systems Review of Systems  Constitutional: Negative for fever.  Gastrointestinal: Negative for vomiting.  Genitourinary: Positive for vaginal bleeding and  vaginal discharge. Negative for difficulty urinating, dysuria, genital sores, pelvic pain and vaginal pain.  All other systems reviewed and are negative.    Physical Exam Updated Vital Signs BP 122/85   Pulse 86   Temp 98.9 F (37.2 C)   Resp 19   SpO2 100%   Physical Exam  Constitutional: She is oriented to person, place, and time. She appears well-developed and well-nourished. No distress.  HENT:  Head: Normocephalic and atraumatic.  Eyes: Conjunctivae are normal.  Neck: Neck supple.  Abdominal: Soft. She exhibits no distension. There is no tenderness.  Genitourinary:  Genitourinary Comments: Scant blood in cervical os, no vaginal discharge, no lesions, bimanual exam limited due to patient discomfort but no lower abdominal tenderness  Neurological: She is alert and oriented to person, place, and time.  Skin: Skin is warm and dry.  Psychiatric: She has a normal mood and affect. Judgment normal.  Nursing note and vitals reviewed.    ED Treatments / Results  Labs (all labs ordered are listed, but only abnormal results are displayed) Labs Reviewed  WET PREP, GENITAL  POC URINE PREG, ED  GC/CHLAMYDIA PROBE AMP ([place]) NOT AT [facility]    EKG None  Radiology No results found.  Procedures Procedures (including critical care time)  Medications Ordered in ED Medications  ondansetron (ZOFRAN-ODT) disintegrating tablet 8 mg (has no administration  in time range)  azithromycin (ZITHROMAX) tablet 1,000 mg (has no administration in time range)  cefTRIAXone (ROCEPHIN) injection 250 mg (has no administration in time range)     Initial Impression / Assessment and Plan / ED Course  I have reviewed the triage vital signs and the nursing notes.  Pertinent labs that were available during my care of the patient were reviewed by me and considered in my medical decision making (see chart for details).    Gave CTX and azithromycin for GC/chlamydia coverage. No signs/sx of PID.  I am signing out to oncoming provider who will f/u on wet prep. I anticipate discharge. I have discussed follow-up with women's clinic or health department as needed and I have reviewed return precautions.  Final Clinical Impressions(s) / ED Diagnoses   Final diagnoses:  STD exposure    ED Discharge Orders    None       Chele Cornell, Ambrose Finlandachel Morgan, MD 11/01/17 2158

## 2017-11-01 NOTE — ED Notes (Signed)
Discharge instructions and prescriptions discussed with Pt. Pt verbalized understanding. Pt stable and ambulatory.   

## 2017-11-02 LAB — GC/CHLAMYDIA PROBE AMP (~~LOC~~) NOT AT ARMC
Chlamydia: NEGATIVE
NEISSERIA GONORRHEA: NEGATIVE

## 2020-01-06 IMAGING — CR DG CHEST 2V
2 series · 2 of 2 positions shown · non-contrast
Comparison: 03/21/2017 radiographs

CLINICAL DATA: Acute chest pain and shortness of breath today.

EXAM:
CHEST - 2 VIEW

[chest pa]
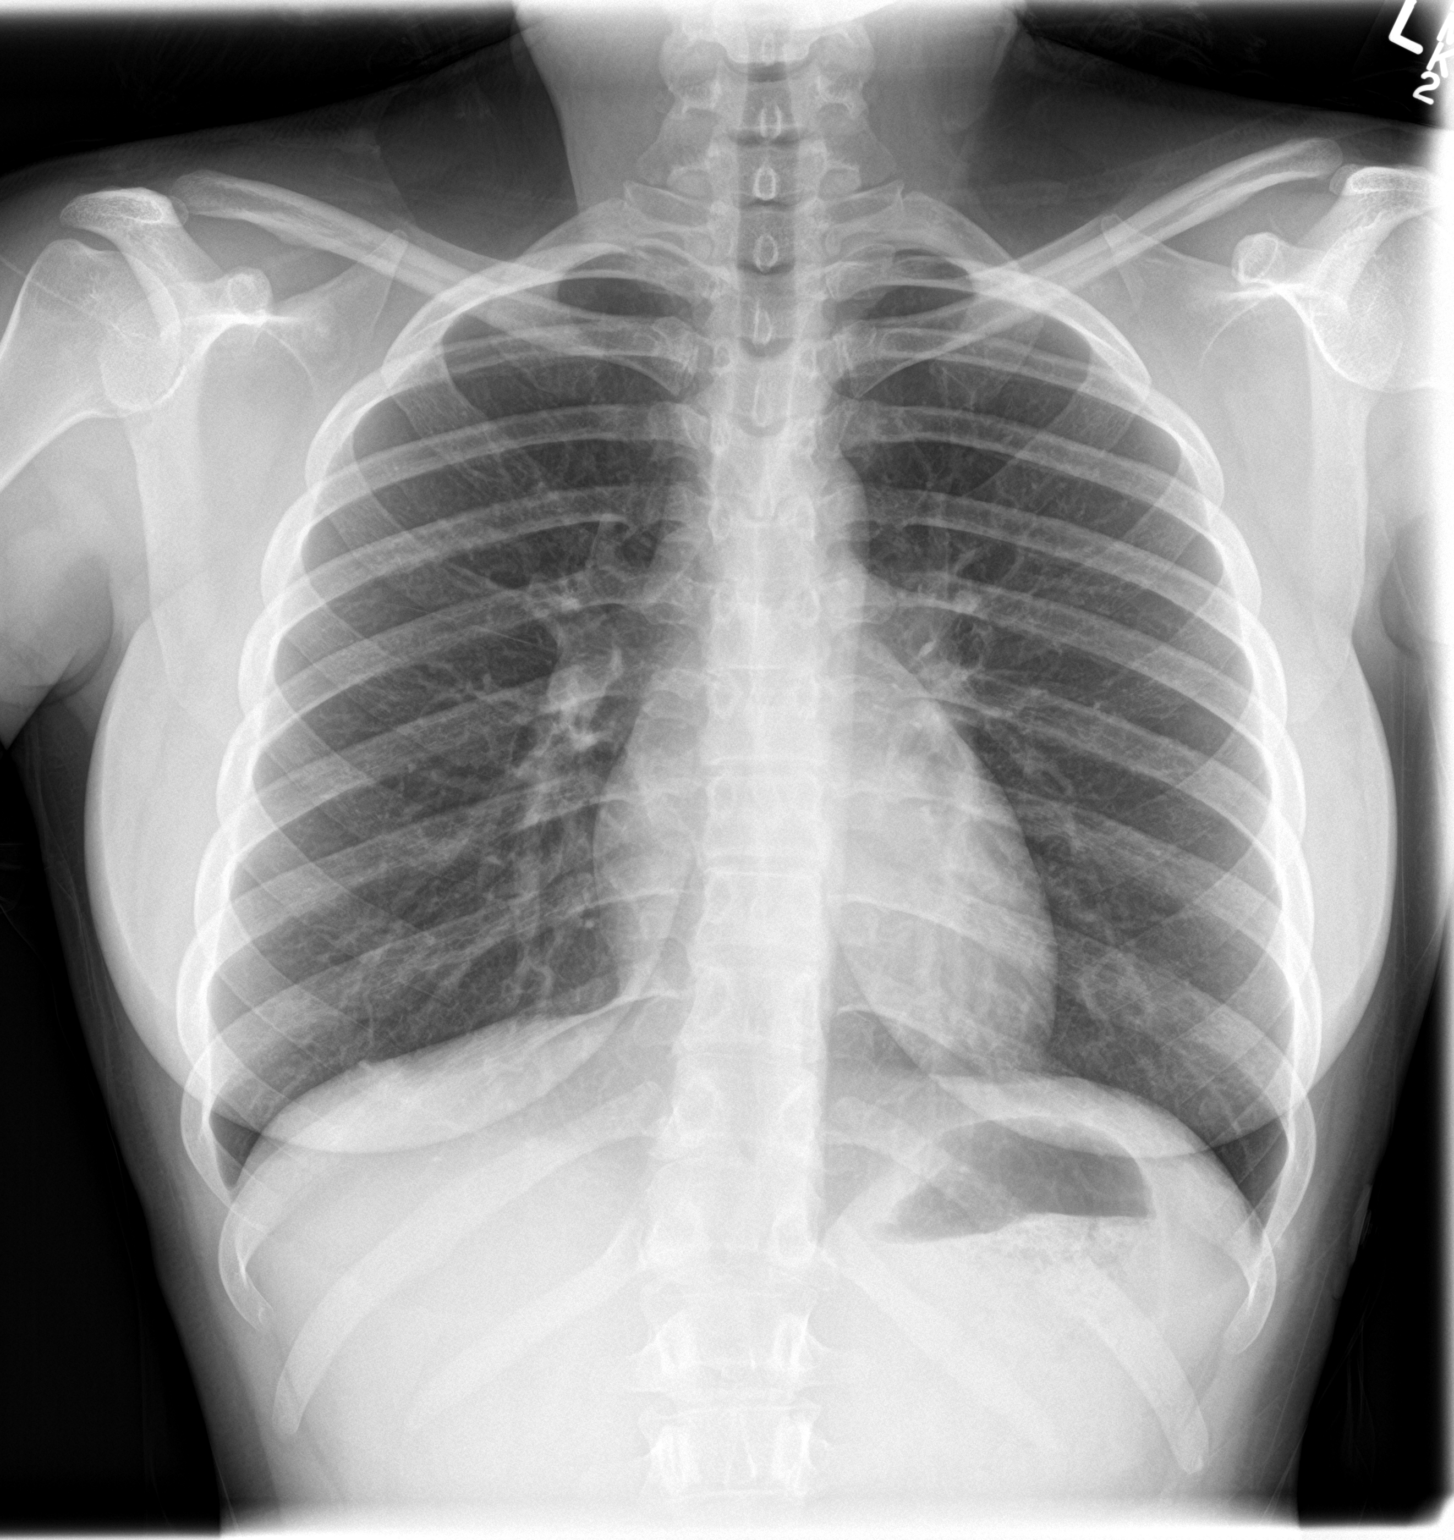

[chest lat]
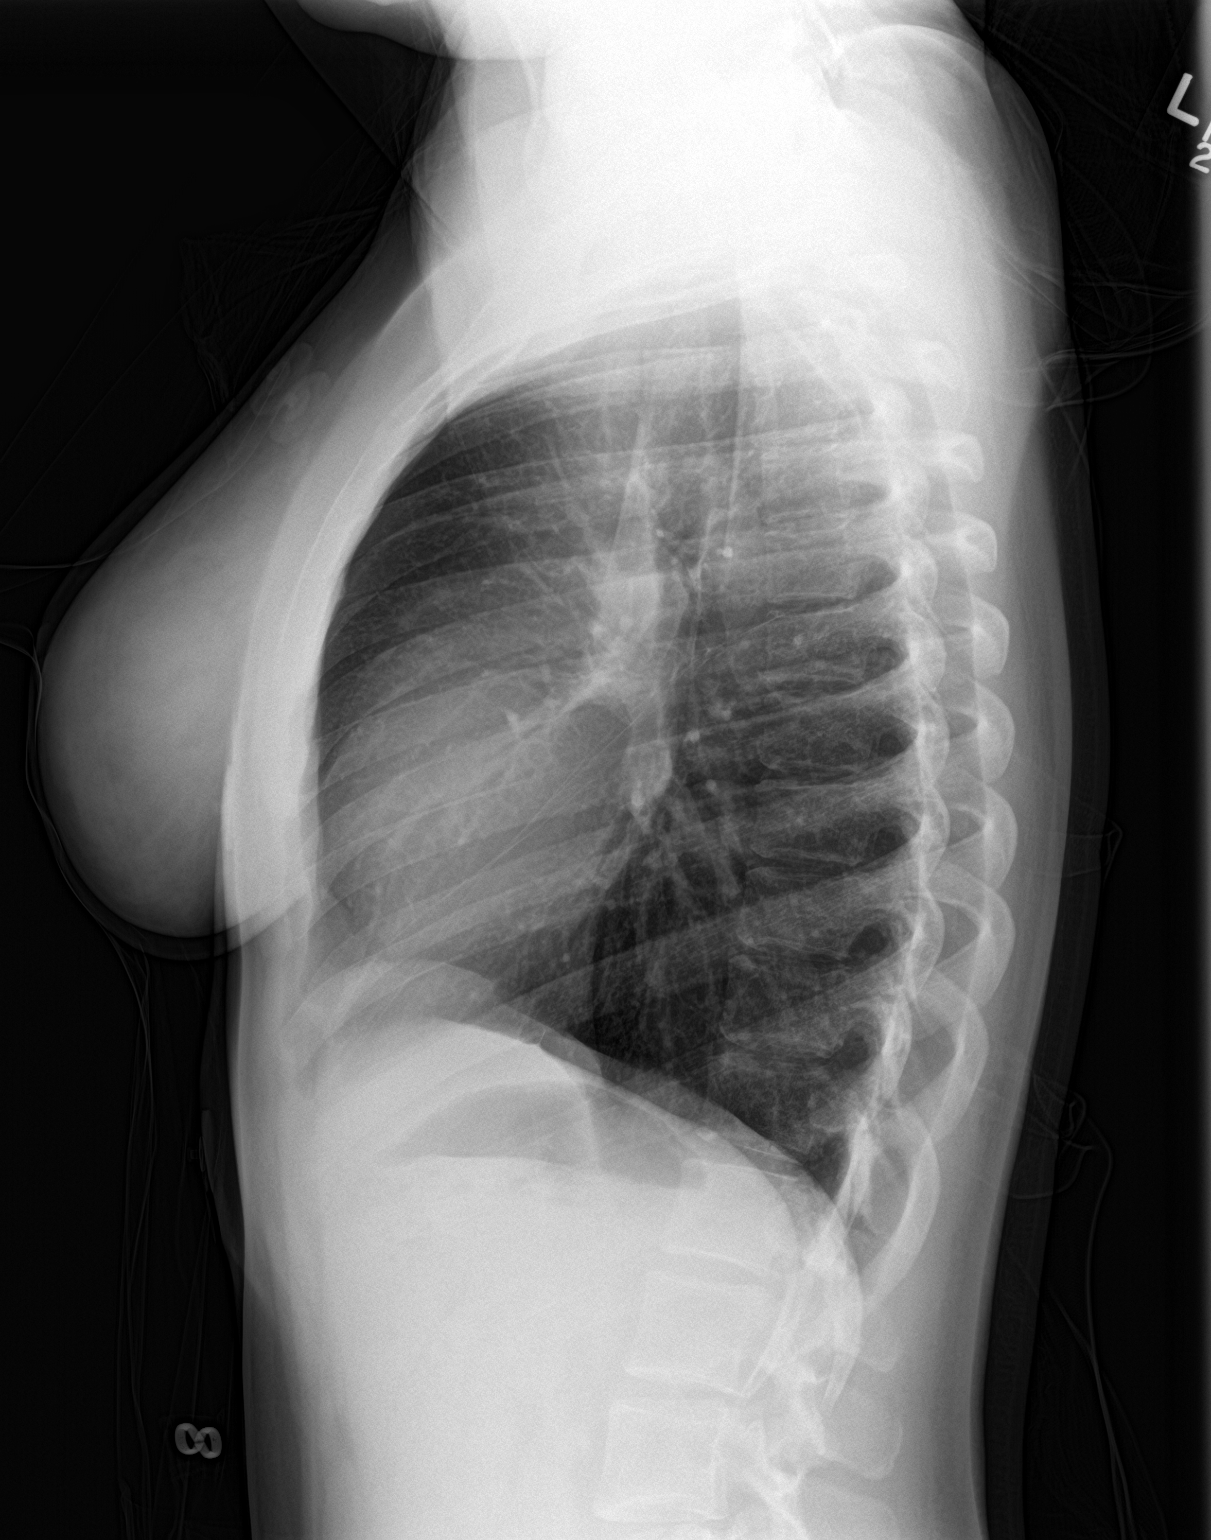

[2 of 2 positions shown; findings below may reference images not displayed]

FINDINGS: The cardiomediastinal silhouette is unremarkable.

There is no evidence of focal airspace disease, pulmonary edema,
suspicious pulmonary nodule/mass, pleural effusion, or pneumothorax.

No acute bony abnormalities are identified.
IMPRESSION: No active cardiopulmonary disease.
